# Patient Record
Sex: Female | Born: 1995 | Race: Black or African American | Hispanic: No | Marital: Single | Smoking: Never smoker
Health system: Southern US, Community
[De-identification: ages and names within clinical notes are randomized; demographics above are authoritative.]

## PROBLEM LIST (undated history)

## (undated) DIAGNOSIS — J45909 Unspecified asthma, uncomplicated: Secondary | ICD-10-CM

## (undated) DIAGNOSIS — T7840XA Allergy, unspecified, initial encounter: Secondary | ICD-10-CM

## (undated) HISTORY — PX: OTHER SURGICAL HISTORY: SHX169

## (undated) HISTORY — PX: HERNIA REPAIR: SHX51

## (undated) HISTORY — DX: Allergy, unspecified, initial encounter: T78.40XA

## (undated) HISTORY — DX: Unspecified asthma, uncomplicated: J45.909

## (undated) HISTORY — PX: WISDOM TOOTH EXTRACTION: SHX21

---
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
## 2015-01-25 ENCOUNTER — Emergency Department (HOSPITAL_COMMUNITY)
Admission: EM | Admit: 2015-01-25 | Discharge: 2015-01-25 | Disposition: A | Discharge status: Home / Self Care | Payer: Medicaid Other | Attending: Emergency Medicine | Admitting: Emergency Medicine

## 2015-01-25 ENCOUNTER — Encounter (HOSPITAL_COMMUNITY): Payer: Self-pay | Admitting: Emergency Medicine

## 2015-01-25 DIAGNOSIS — B373 Candidiasis of vulva and vagina: Secondary | ICD-10-CM | POA: Diagnosis not present

## 2015-01-25 DIAGNOSIS — Z3202 Encounter for pregnancy test, result negative: Secondary | ICD-10-CM | POA: Insufficient documentation

## 2015-01-25 DIAGNOSIS — Z7951 Long term (current) use of inhaled steroids: Secondary | ICD-10-CM | POA: Insufficient documentation

## 2015-01-25 DIAGNOSIS — Z79899 Other long term (current) drug therapy: Secondary | ICD-10-CM | POA: Diagnosis not present

## 2015-01-25 DIAGNOSIS — L292 Pruritus vulvae: Secondary | ICD-10-CM | POA: Diagnosis present

## 2015-01-25 DIAGNOSIS — Z91040 Latex allergy status: Secondary | ICD-10-CM | POA: Insufficient documentation

## 2015-01-25 DIAGNOSIS — B3731 Acute candidiasis of vulva and vagina: Secondary | ICD-10-CM

## 2015-01-25 LAB — URINALYSIS, ROUTINE W REFLEX MICROSCOPIC
Bilirubin Urine: NEGATIVE
Glucose, UA: NEGATIVE mg/dL
Ketones, ur: NEGATIVE mg/dL
Nitrite: NEGATIVE
PROTEIN: NEGATIVE mg/dL
Specific Gravity, Urine: 1.021 (ref 1.005–1.030)
Urobilinogen, UA: 0.2 mg/dL (ref 0.0–1.0)
pH: 5 (ref 5.0–8.0)

## 2015-01-25 LAB — WET PREP, GENITAL
Clue Cells Wet Prep HPF POC: NONE SEEN
Trich, Wet Prep: NONE SEEN

## 2015-01-25 LAB — URINE MICROSCOPIC-ADD ON

## 2015-01-25 LAB — PREGNANCY, URINE: Preg Test, Ur: NEGATIVE

## 2015-01-25 MED ORDER — FLUCONAZOLE 150 MG PO TABS
150.0000 mg | ORAL_TABLET | Freq: Once | ORAL | Status: AC
  Administered 2015-01-25: 150 mg via ORAL
  Filled 2015-01-25: qty 1

## 2015-01-25 MED ORDER — FLUCONAZOLE 150 MG PO TABS: 150.0000 mg | ORAL_TABLET | Freq: Every day | ORAL | Status: AC

## 2015-01-25 NOTE — Discharge Instructions (Signed)
Diflucan Rx if sx not resolved in 48 hours.  Candida Infection A Candida infection (also called yeast, fungus, and Monilia infection) is an overgrowth of yeast that can occur anywhere on the body. A yeast infection commonly occurs in warm, moist body areas. Usually, the infection remains localized but can spread to become a systemic infection. A yeast infection may be a sign of a more severe disease such as diabetes, leukemia, or AIDS. A yeast infection can occur in both men and women. In women, Candida vaginitis is a vaginal infection. It is one of the most common causes of vaginitis. Men usually do not have symptoms or know they have an infection until other problems develop. Men may find out they have a yeast infection because their sex partner has a yeast infection. Uncircumcised men are more likely to get a yeast infection than circumcised men. This is because the uncircumcised glans is not exposed to air and does not remain as dry as that of a circumcised glans. Older adults may develop yeast infections around dentures. CAUSES  Women  Antibiotics.  Steroid medication taken for a long time.  Being overweight (obese).  Diabetes.  Poor immune condition.  Certain serious medical conditions.  Immune suppressive medications for organ transplant patients.  Chemotherapy.  Pregnancy.  Menstruation.  Stress and fatigue.  Intravenous drug use.  Oral contraceptives.  Wearing tight-fitting clothes in the crotch area.  Catching it from a sex partner who has a yeast infection.  Spermicide.  Intravenous, urinary, or other catheters. Men  Catching it from a sex partner who has a yeast infection.  Having oral or anal sex with a person who has the infection.  Spermicide.  Diabetes.  Antibiotics.  Poor immune system.  Medications that suppress the immune system.  Intravenous drug use.  Intravenous, urinary, or other catheters. SYMPTOMS  Women  Thick, white vaginal  discharge.  Vaginal itching.  Redness and swelling in and around the vagina.  Irritation of the lips of the vagina and perineum.  Blisters on the vaginal lips and perineum.  Painful sexual intercourse.  Low blood sugar (hypoglycemia).  Painful urination.  Bladder infections.  Intestinal problems such as constipation, indigestion, bad breath, bloating, increase in gas, diarrhea, or loose stools. Men  Men may develop intestinal problems such as constipation, indigestion, bad breath, bloating, increase in gas, diarrhea, or loose stools.  Dry, cracked skin on the penis with itching or discomfort.  Jock itch.  Dry, flaky skin.  Athlete's foot.  Hypoglycemia. DIAGNOSIS  Women  A history and an exam are performed.  The discharge may be examined under a microscope.  A culture may be taken of the discharge. Men  A history and an exam are performed.  Any discharge from the penis or areas of cracked skin will be looked at under the microscope and cultured.  Stool samples may be cultured. TREATMENT  Women  Vaginal antifungal suppositories and creams.  Medicated creams to decrease irritation and itching on the outside of the vagina.  Warm compresses to the perineal area to decrease swelling and discomfort.  Oral antifungal medications.  Medicated vaginal suppositories or cream for repeated or recurrent infections.  Wash and dry the irritation areas before applying the cream.  Eating yogurt with Lactobacillus may help with prevention and treatment.  Sometimes painting the vagina with gentian violet solution may help if creams and suppositories do not work. Men  Antifungal creams and oral antifungal medications.  Sometimes treatment must continue for 30 days after  the symptoms go away to prevent recurrence. HOME CARE INSTRUCTIONS  Women  Use cotton underwear and avoid tight-fitting clothing.  Avoid colored, scented toilet paper and deodorant tampons or  pads.  Do not douche.  Keep your diabetes under control.  Finish all the prescribed medications.  Keep your skin clean and dry.  Consume milk or yogurt with Lactobacillus-active culture regularly. If you get frequent yeast infections and think that is what the infection is, there are over-the-counter medications that you can get. If the infection does not show healing in 3 days, talk to your caregiver.  Tell your sex partner you have a yeast infection. Your partner may need treatment also, especially if your infection does not clear up or recurs. Men  Keep your skin clean and dry.  Keep your diabetes under control.  Finish all prescribed medications.  Tell your sex partner that you have a yeast infection so he or she can be treated if necessary. SEEK MEDICAL CARE IF:   Your symptoms do not clear up or worsen in one week after treatment.  You have an oral temperature above 102 F (38.9 C).  You have trouble swallowing or eating for a prolonged time.  You develop blisters on and around your vagina.  You develop vaginal bleeding and it is not your menstrual period.  You develop abdominal pain.  You develop intestinal problems as mentioned above.  You get weak or light-headed.  You have painful or increased urination.  You have pain during sexual intercourse. MAKE SURE YOU:   Understand these instructions.  Will watch your condition.  Will get help right away if you are not doing well or get worse. Document Released: 01/05/2005 Document Revised: 04/14/2014 Document Reviewed: 04/19/2010 [place] [facility] Patient Information 2015 [place], [place]. This information is not intended to replace advice given to you by your health care provider. Make sure you discuss any questions you have with your health care provider.  Candidal Vulvovaginitis Candidal vulvovaginitis is an infection of the vagina and vulva. The vulva is the skin around the opening of the vagina. This may cause  itching and discomfort in and around the vagina.  HOME CARE  Only take medicine as told by your doctor.  Do not have sex (intercourse) until the infection is healed or as told by your doctor.  Practice safe sex.  Tell your sex partner about your infection.  Do not douche or use tampons.  Wear cotton underwear. Do not wear tight pants or panty hose.  Eat yogurt. This may help treat and prevent yeast infections. GET HELP RIGHT AWAY IF:   You have a fever.  Your problems get worse during treatment or do not get better in 3 days.  You have discomfort, irritation, or itching in your vagina or vulva area.  You have pain after sex.  You start to get belly (abdominal) pain. MAKE SURE YOU:  Understand these instructions.  Will watch your condition.  Will get help right away if you are not doing well or get worse. Document Released: 02/24/2009 Document Revised: 12/03/2013 Document Reviewed: 02/24/2009 [facility] Patient Information 2015 [place], [place]. This information is not intended to replace advice given to you by your health care provider. Make sure you discuss any questions you have with your health care provider.

## 2015-01-25 NOTE — ED Notes (Signed)
Pt states she thinks she may have a yeast infection  Pt is c/o irritation and itching  Denies any discharge

## 2015-01-25 NOTE — ED Provider Notes (Signed)
CSN: [id number]     Arrival date & time 01/25/15  1928 History   First MD Initiated Contact with Patient 01/25/15 1943     Chief Complaint  Patient presents with  . Vaginal Itching      HPI  Patient presents for evaluation of vaginal itching and burning. Started having some symptoms just a is itching and feels like she is swollen and sore. Physician dilation. No history of STD. No history of candida. No UTI symptoms. Denies pregnancy. Has an Implanon for pregnancy prophylaxis. No. For 1 year after it was placed.  History reviewed. No pertinent past medical history. Past Surgical History  Procedure Laterality Date  . Hernia repair    . Cyst removed from right knee     Family History  Problem Relation Age of Onset  . Hypertension Mother   . Hypertension Other   . Diabetes Other    History  Substance Use Topics  . Smoking status: Never Smoker   . Smokeless tobacco: Not on file  . Alcohol Use: No   OB History    No data available     Review of Systems  Constitutional: Negative for fever, chills, diaphoresis, appetite change and fatigue.  HENT: Negative for mouth sores, sore throat and trouble swallowing.   Eyes: Negative for visual disturbance.  Respiratory: Negative for cough, chest tightness, shortness of breath and wheezing.   Cardiovascular: Negative for chest pain.  Gastrointestinal: Negative for nausea, vomiting, abdominal pain, diarrhea and abdominal distention.  Endocrine: Negative for polydipsia, polyphagia and polyuria.  Genitourinary: Positive for vaginal discharge. Negative for dysuria, frequency and hematuria.  Musculoskeletal: Negative for gait problem.  Skin: Negative for color change, pallor and rash.  Neurological: Negative for dizziness, syncope, light-headedness and headaches.  Hematological: Does not bruise/bleed easily.  Psychiatric/Behavioral: Negative for behavioral problems and confusion.      Allergies  Latex  Home Medications   Prior to  Admission medications   Medication Sig Start Date End Date Taking? Authorizing Provider  Albuterol Sulfate (PROVENTIL HFA IN) Inhale 2 puffs into the lungs daily as needed (shortness of breath).   Yes Historical Provider, MD  cetirizine (ZYRTEC) 10 MG tablet Take 10 mg by mouth daily.   Yes Historical Provider, MD  mometasone-formoterol (DULERA) 100-5 MCG/ACT AERO Inhale 2 puffs into the lungs 2 (two) times daily as needed for wheezing.   Yes Historical Provider, MD  montelukast (SINGULAIR) 10 MG tablet Take 10 mg by mouth at bedtime.   Yes Historical Provider, MD  Olopatadine HCl 0.2 % SOLN Apply 1 drop to eye 2 (two) times daily as needed (allergies).   Yes Historical Provider, MD  fluconazole (DIFLUCAN) 150 MG tablet Take 1 tablet (150 mg total) by mouth daily. 01/25/15 02/01/15  Rolland PorterMark Dierks Wach, MD   BP 112/65 mmHg  Pulse 79  Temp(Src) 98.1 F (36.7 C) (Oral)  Resp 18  Wt 125 lb (56.7 kg)  SpO2 99% Physical Exam  Constitutional: She is oriented to person, place, and time. She appears well-developed and well-nourished. No distress.  HENT:  Head: Normocephalic.  Eyes: Conjunctivae are normal. Pupils are equal, round, and reactive to light. No scleral icterus.  Neck: Normal range of motion. Neck supple. No thyromegaly present.  Cardiovascular: Normal rate and regular rhythm.  Exam reveals no gallop and no friction rub.   No murmur heard. Pulmonary/Chest: Effort normal and breath sounds normal. No respiratory distress. She has no wheezes. She has no rales.  Abdominal: Soft. Bowel sounds are normal.  She exhibits no distension. There is no tenderness. There is no rebound.  Genitourinary:    Musculoskeletal: Normal range of motion.  Neurological: She is alert and oriented to person, place, and time.  Skin: Skin is warm and dry. No rash noted.  Psychiatric: She has a normal mood and affect. Her behavior is normal.    ED Course  Procedures (including critical care time) Labs Review Labs  Reviewed  WET PREP, GENITAL - Abnormal; Notable for the following:    Yeast Wet Prep HPF POC MODERATE (*)    WBC, Wet Prep HPF POC FEW (*)    All other components within normal limits  URINALYSIS, ROUTINE W REFLEX MICROSCOPIC - Abnormal; Notable for the following:    Hgb urine dipstick SMALL (*)    Leukocytes, UA MODERATE (*)    All other components within normal limits  PREGNANCY, URINE  URINE MICROSCOPIC-ADD ON  GC/CHLAMYDIA PROBE AMP ([place])    Imaging Review No results found.   EKG Interpretation None      MDM   Final diagnoses:  Candida vaginitis    Given Diflucan. Rx for repeat if not improving.    Rolland Porter, MD 01/25/15 2045

## 2015-01-26 LAB — GC/CHLAMYDIA PROBE AMP (~~LOC~~) NOT AT ARMC
CHLAMYDIA, DNA PROBE: NEGATIVE
Neisseria Gonorrhea: NEGATIVE

## 2015-10-18 ENCOUNTER — Emergency Department (HOSPITAL_COMMUNITY)
Admission: EM | Admit: 2015-10-18 | Discharge: 2015-10-18 | Disposition: A | Discharge status: Home / Self Care | Payer: Medicaid Other | Attending: Physician Assistant | Admitting: Physician Assistant

## 2015-10-18 ENCOUNTER — Emergency Department (HOSPITAL_COMMUNITY): Payer: Medicaid Other

## 2015-10-18 ENCOUNTER — Encounter (HOSPITAL_COMMUNITY): Payer: Self-pay | Admitting: Nurse Practitioner

## 2015-10-18 DIAGNOSIS — S3991XA Unspecified injury of abdomen, initial encounter: Secondary | ICD-10-CM | POA: Insufficient documentation

## 2015-10-18 DIAGNOSIS — Y9389 Activity, other specified: Secondary | ICD-10-CM | POA: Diagnosis not present

## 2015-10-18 DIAGNOSIS — Z91040 Latex allergy status: Secondary | ICD-10-CM | POA: Diagnosis not present

## 2015-10-18 DIAGNOSIS — S3992XA Unspecified injury of lower back, initial encounter: Secondary | ICD-10-CM | POA: Diagnosis not present

## 2015-10-18 DIAGNOSIS — S20311A Abrasion of right front wall of thorax, initial encounter: Secondary | ICD-10-CM | POA: Diagnosis not present

## 2015-10-18 DIAGNOSIS — Z79899 Other long term (current) drug therapy: Secondary | ICD-10-CM | POA: Insufficient documentation

## 2015-10-18 DIAGNOSIS — T148 Other injury of unspecified body region: Secondary | ICD-10-CM | POA: Insufficient documentation

## 2015-10-18 DIAGNOSIS — S299XXA Unspecified injury of thorax, initial encounter: Secondary | ICD-10-CM | POA: Diagnosis present

## 2015-10-18 DIAGNOSIS — Y92410 Unspecified street and highway as the place of occurrence of the external cause: Secondary | ICD-10-CM | POA: Insufficient documentation

## 2015-10-18 DIAGNOSIS — Z7951 Long term (current) use of inhaled steroids: Secondary | ICD-10-CM | POA: Insufficient documentation

## 2015-10-18 DIAGNOSIS — R0789 Other chest pain: Secondary | ICD-10-CM

## 2015-10-18 DIAGNOSIS — Y998 Other external cause status: Secondary | ICD-10-CM | POA: Diagnosis not present

## 2015-10-18 DIAGNOSIS — S199XXA Unspecified injury of neck, initial encounter: Secondary | ICD-10-CM | POA: Diagnosis not present

## 2015-10-18 DIAGNOSIS — T148XXA Other injury of unspecified body region, initial encounter: Secondary | ICD-10-CM

## 2015-10-18 MED ORDER — CYCLOBENZAPRINE HCL 10 MG PO TABS
10.0000 mg | ORAL_TABLET | Freq: Once | ORAL | Status: AC
  Administered 2015-10-18: 10 mg via ORAL
  Filled 2015-10-18: qty 1

## 2015-10-18 MED ORDER — IBUPROFEN 800 MG PO TABS
800.0000 mg | ORAL_TABLET | Freq: Once | ORAL | Status: AC
  Administered 2015-10-18: 800 mg via ORAL
  Filled 2015-10-18: qty 1

## 2015-10-18 MED ORDER — CYCLOBENZAPRINE HCL 10 MG PO TABS: 10.0000 mg | ORAL_TABLET | Freq: Two times a day (BID) | ORAL | Status: DC | PRN

## 2015-10-18 MED ORDER — IBUPROFEN 800 MG PO TABS: 800.0000 mg | ORAL_TABLET | Freq: Three times a day (TID) | ORAL | Status: DC
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
## 2015-10-18 NOTE — Discharge Instructions (Signed)
Chest Wall Pain °Chest wall pain is pain in or around the bones and muscles of your chest. Sometimes, an injury causes this pain. Sometimes, the cause may not be known. This pain may take several weeks or longer to get better. °HOME CARE INSTRUCTIONS  °Pay attention to any changes in your symptoms. Take these actions to help with your pain:  °· Rest as told by your health care provider.   °· Avoid activities that cause pain. These include any activities that use your chest muscles or your abdominal and side muscles to lift heavy items.    °· If directed, apply ice to the painful area: °· Put ice in a plastic bag. °· Place a towel between your skin and the bag. °· Leave the ice on for 20 minutes, 2-3 times per day. °· Take over-the-counter and prescription medicines only as told by your health care provider. °· Do not use tobacco products, including cigarettes, chewing tobacco, and e-cigarettes. If you need help quitting, ask your health care provider. °· Keep all follow-up visits as told by your health care provider. This is important. °SEEK MEDICAL CARE IF: °· You have a fever. °· Your chest pain becomes worse. °· You have new symptoms. °SEEK IMMEDIATE MEDICAL CARE IF: °· You have nausea or vomiting. °· You feel sweaty or light-headed. °· You have a cough with phlegm (sputum) or you cough up blood. °· You develop shortness of breath. °  °This information is not intended to replace advice given to you by your health care provider. Make sure you discuss any questions you have with your health care provider. °  °Document Released: 11/28/2005 Document Revised: 08/19/2015 Document Reviewed: 02/23/2015 °Elsevier Interactive Patient Education ©2016 [employer] °Heat Therapy °Heat therapy can help ease sore, stiff, injured, and tight muscles and joints. Heat relaxes your muscles, which may help ease your pain.  °RISKS AND COMPLICATIONS °If you have any of the following conditions, do not use heat therapy unless your  health care provider has approved: °· Poor circulation. °· Healing wounds or scarred skin in the area being treated. °· Diabetes, heart disease, or high blood pressure. °· Not being able to feel (numbness) the area being treated. °· Unusual swelling of the area being treated. °· Active infections. °· Blood clots. °· Cancer. °· Inability to communicate pain. This may include young children and people who have problems with their brain function (dementia). °· Pregnancy. °Heat therapy should only be used on old, pre-existing, or long-lasting (chronic) injuries. Do not use heat therapy on new injuries unless directed by your health care provider. °HOW TO USE HEAT THERAPY °There are several different kinds of heat therapy, including: °· Moist heat pack. °· Warm water bath. °· Hot water bottle. °· Electric heating pad. °· Heated gel pack. °· Heated wrap. °· Electric heating pad. °Use the heat therapy method suggested by your health care provider. Follow your health care provider's instructions on when and how to use heat therapy. °GENERAL HEAT THERAPY RECOMMENDATIONS °· Do not sleep while using heat therapy. Only use heat therapy while you are awake. °· Your skin may turn pink while using heat therapy. Do not use heat therapy if your skin turns red. °· Do not use heat therapy if you have new pain. °· High heat or long exposure to heat can cause burns. Be careful when using heat therapy to avoid burning your skin. °· Do not use heat therapy on areas of your skin that are already irritated, such as with a   rash or sunburn. SEEK MEDICAL CARE IF:  You have blisters, redness, swelling, or numbness.  You have new pain.  Your pain is worse. MAKE SURE YOU:  Understand these instructions.  Will watch your condition.  Will get help right away if you are not doing well or get worse.   This information is not intended to replace advice given to you by your health care provider. Make sure you discuss any questions you  have with your health care provider.   Document Released: 02/20/2012 Document Revised: 12/19/2014 Document Reviewed: 01/21/2014 Elsevier Interactive Patient Education 2016 [employer] Inc[employer] Muscle Strain A muscle strain is an injury that occurs when a muscle is stretched beyond its normal length. Usually a small number of muscle fibers are torn when this happens. Muscle strain is rated in degrees. First-degree strains have the least amount of muscle fiber tearing and pain. Second-degree and third-degree strains have increasingly more tearing and pain.  Usually, recovery from muscle strain takes 1-2 weeks. Complete healing takes 5-6 weeks.  CAUSES  Muscle strain happens when a sudden, violent force placed on a muscle stretches it too far. This may occur with lifting, sports, or a fall.  RISK FACTORS Muscle strain is especially common in athletes.  SIGNS AND SYMPTOMS At the site of the muscle strain, there may be:  Pain.  Bruising.  Swelling.  Difficulty using the muscle due to pain or lack of normal function. DIAGNOSIS  Your health care provider will perform a physical exam and ask about your medical history. TREATMENT  Often, the best treatment for a muscle strain is resting, icing, and applying cold compresses to the injured area.  HOME CARE INSTRUCTIONS   Use the PRICE method of treatment to promote muscle healing during the first 2-3 days after your injury. The PRICE method involves:  Protecting the muscle from being injured again.  Restricting your activity and resting the injured body part.  Icing your injury. To do this, put ice in a plastic bag. Place a towel between your skin and the bag. Then, apply the ice and leave it on from 15-20 minutes each hour. After the third day, switch to moist heat packs.  Apply compression to the injured area with a splint or elastic bandage. Be careful not to wrap it too tightly. This may interfere with blood circulation or increase  swelling.  Elevate the injured body part above the level of your heart as often as you can.  Only take over-the-counter or prescription medicines for pain, discomfort, or fever as directed by your health care provider.  Warming up prior to exercise helps to prevent future muscle strains. SEEK MEDICAL CARE IF:   You have increasing pain or swelling in the injured area.  You have numbness, tingling, or a significant loss of strength in the injured area. MAKE SURE YOU:   Understand these instructions.  Will watch your condition.  Will get help right away if you are not doing well or get worse.   This information is not intended to replace advice given to you by your health care provider. Make sure you discuss any questions you have with your health care provider.   Document Released: 11/28/2005 Document Revised: 09/18/2013 Document Reviewed: 06/27/2013 Elsevier Interactive Patient Education 2016 [employer] Tourist information centre managerMotor Vehicle Collision It is common to have multiple bruises and sore muscles after a motor vehicle collision (MVC). These tend to feel worse for the first 24 hours. You may have the most stiffness and soreness over the  first several hours. You may also feel worse when you wake up the first morning after your collision. After this point, you will usually begin to improve with each day. The speed of improvement often depends on the severity of the collision, the number of injuries, and the location and nature of these injuries. HOME CARE INSTRUCTIONS  Put ice on the injured area.  Put ice in a plastic bag.  Place a towel between your skin and the bag.  Leave the ice on for 15-20 minutes, 3-4 times a day, or as directed by your health care provider.  Drink enough fluids to keep your urine clear or pale yellow. Do not drink alcohol.  Take a warm shower or bath once or twice a day. This will increase blood flow to sore muscles.  You may return to activities as directed by your  caregiver. Be careful when lifting, as this may aggravate neck or back pain.  Only take over-the-counter or prescription medicines for pain, discomfort, or fever as directed by your caregiver. Do not use aspirin. This may increase bruising and bleeding. SEEK IMMEDIATE MEDICAL CARE IF:  You have numbness, tingling, or weakness in the arms or legs.  You develop severe headaches not relieved with medicine.  You have severe neck pain, especially tenderness in the middle of the back of your neck.  You have changes in bowel or bladder control.  There is increasing pain in any area of the body.  You have shortness of breath, light-headedness, dizziness, or fainting.  You have chest pain.  You feel sick to your stomach (nauseous), throw up (vomit), or sweat.  You have increasing abdominal discomfort.  There is blood in your urine, stool, or vomit.  You have pain in your shoulder (shoulder strap areas).  You feel your symptoms are getting worse. MAKE SURE YOU:  Understand these instructions.  Will watch your condition.  Will get help right away if you are not doing well or get worse.   This information is not intended to replace advice given to you by your health care provider. Make sure you discuss any questions you have with your health care provider.   Document Released: 11/28/2005 Document Revised: 12/19/2014 Document Reviewed: 04/27/2011 Elsevier Interactive Patient Education [employer]
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
## 2017-01-11 ENCOUNTER — Telehealth: Payer: Self-pay | Admitting: General Practice

## 2017-01-11 NOTE — Telephone Encounter (Signed)
APT. REMINDER CALL, LMTCB °

## 2017-01-12 ENCOUNTER — Ambulatory Visit: Payer: Medicaid Other

## 2017-01-16 ENCOUNTER — Encounter: Payer: Self-pay | Admitting: Internal Medicine

## 2017-01-16 ENCOUNTER — Ambulatory Visit (INDEPENDENT_AMBULATORY_CARE_PROVIDER_SITE_OTHER): Payer: Medicaid Other | Admitting: Internal Medicine

## 2017-01-16 DIAGNOSIS — Z79899 Other long term (current) drug therapy: Secondary | ICD-10-CM

## 2017-01-16 DIAGNOSIS — J302 Other seasonal allergic rhinitis: Secondary | ICD-10-CM | POA: Insufficient documentation

## 2017-01-16 DIAGNOSIS — Z9689 Presence of other specified functional implants: Secondary | ICD-10-CM

## 2017-01-16 DIAGNOSIS — J4520 Mild intermittent asthma, uncomplicated: Secondary | ICD-10-CM | POA: Diagnosis present

## 2017-01-16 DIAGNOSIS — Z3046 Encounter for surveillance of implantable subdermal contraceptive: Secondary | ICD-10-CM | POA: Insufficient documentation

## 2017-01-16 DIAGNOSIS — Z7951 Long term (current) use of inhaled steroids: Secondary | ICD-10-CM

## 2017-01-16 DIAGNOSIS — Z0000 Encounter for general adult medical examination without abnormal findings: Secondary | ICD-10-CM | POA: Insufficient documentation

## 2017-01-16 MED ORDER — CETIRIZINE HCL 10 MG PO TABS: 10.0000 mg | ORAL_TABLET | Freq: Every day | ORAL | 11 refills | Status: AC

## 2017-01-16 MED ORDER — MOMETASONE FURO-FORMOTEROL FUM 100-5 MCG/ACT IN AERO: 2.0000 | INHALATION_SPRAY | Freq: Two times a day (BID) | RESPIRATORY_TRACT | 5 refills | Status: DC | PRN

## 2017-01-16 MED ORDER — ALBUTEROL SULFATE HFA 108 (90 BASE) MCG/ACT IN AERS: 2.0000 | INHALATION_SPRAY | Freq: Every day | RESPIRATORY_TRACT | 5 refills | Status: DC | PRN

## 2017-01-16 MED ORDER — MONTELUKAST SODIUM 10 MG PO TABS: 10.0000 mg | ORAL_TABLET | Freq: Every day | ORAL | 11 refills | Status: DC

## 2017-01-16 NOTE — Assessment & Plan Note (Signed)
Patient states that she was diagnosed with asthma as a child. Her symptoms are well controlled. She is currently using albuterol and Dulera prn, cetrizine and singulair. Her symptoms are more bothersome during the springtime. In the last month, she had one nighttime awakening with wheezing. She has not used her albuterol inhaler in the last one week. She is not limited in her daily activities.   Assessment: Mild Intermittent Asthma  Plan: -Continue albuterol prn -Continue Dulera (prn during winter- BID in spring/summer) -Continue Cetirizine and Singulair

## 2017-01-16 NOTE — Progress Notes (Signed)
    CC: asthma follow up  HPI: [name] is a 21 y.o. female with PMHx of Mild Intermittent Asthma who presents to the clinic for follow up for asthma.   Patient states that she was diagnosed with asthma as a child. Her symptoms are well controlled. She is currently using albuterol and Dulera prn, cetrizine and singulair. Her symptoms are more bothersome during the springtime. In the last month, she had one nighttime awakening with wheezing. She has not used her albuterol inhaler in the last one week. She is not limited in her daily activities.   Past Medical History:  Diagnosis Date  . Allergy   . Asthma     Review of Systems: Please see pertinent ROS reviewed in HPI and problem based charting.   Physical Exam: Vitals:   01/16/17 0958  BP: 114/65  Pulse: 80  Temp: 97.8 F (36.6 C)  TempSrc: Oral  SpO2: 100%  Weight: 119 lb 11.2 oz (54.3 kg)   General: Vital signs reviewed.  Patient is well-developed and well-nourished, in no acute distress and cooperative with exam.  Head: Normocephalic and atraumatic. Eyes: EOMI, conjunctivae normal, no scleral icterus.  Ears: Normal tympanic membranes bilaterally Mouth: Normal posterior oropharynx.  Nose: Erythematous, edematous nasal turbinates.  Neck: Supple, trachea midline, no thyromegaly present.  Cardiovascular: RRR, S1 normal, S2 normal, no murmurs, gallops, or rubs. Pulmonary/Chest: Mild expiratory wheeze in left mid lung field, no rales, or rhonchi. Abdominal: Soft, non-tender, non-distended, BS +, no masses, organomegaly, or guarding present.  Extremities: No lower extremity edema bilaterally, pulses symmetric and intact bilaterally.  Skin: Warm, dry and intact. No rashes or erythema. Psychiatric: Normal mood and affect. speech and behavior is normal. Cognition and memory are normal.   Assessment & Plan:  See encounters tab for problem based medical decision making. Patient discussed with Dr. Rogelia BogaButcher

## 2017-01-16 NOTE — Assessment & Plan Note (Signed)
Due for Pap Smear. Patient would like referral to Ob/Gyn for removal of nexplanon and pap smear. Also discussed that she can received her pap smear here as well.   Plan: -Referral to Ob/Gyn

## 2017-01-16 NOTE — Assessment & Plan Note (Signed)
Patient has a Nexplanon implant placed >3 years ago, she reports she was supposed to get it out in the fall. She would like a referral to Ob/Gyn for removal.  Plan: -referral to Ob/Gyn

## 2017-01-16 NOTE — Patient Instructions (Signed)
Ms. Evelyn Rodriguez,  I have refilled your Singulair, Dulera, Albuterol, and Zyrtec. Please continue treatment as prescribed. We will also refer you back to Ob/Gyn for Nexplanon removal and a Pap Smear. Follow up with us in one year, sooner if needed.

## 2017-01-16 NOTE — Assessment & Plan Note (Signed)
Controlled on current regimen.   Plan: -Continue Cetirizine and Singulair

## 2017-01-17 ENCOUNTER — Telehealth: Payer: Self-pay | Admitting: *Deleted

## 2017-01-17 MED ORDER — MONTELUKAST SODIUM 10 MG PO TABS: 10.0000 mg | ORAL_TABLET | Freq: Every day | ORAL | 11 refills | Status: AC

## 2017-01-17 MED ORDER — ALBUTEROL SULFATE HFA 108 (90 BASE) MCG/ACT IN AERS: 2.0000 | INHALATION_SPRAY | Freq: Every day | RESPIRATORY_TRACT | 5 refills | Status: AC | PRN

## 2017-01-17 MED ORDER — MOMETASONE FURO-FORMOTEROL FUM 100-5 MCG/ACT IN AERO: 2.0000 | INHALATION_SPRAY | Freq: Two times a day (BID) | RESPIRATORY_TRACT | 5 refills | Status: AC | PRN

## 2017-01-17 MED FILL — MONTELUKAST SOD 10 MG TAB: 10 | 30 days supply | Qty: 30 | Fill #0

## 2017-01-17 MED FILL — PROVENTIL HFA 90 MCG INH: 108 (90 BAS | 25 days supply | Qty: 7 | Fill #0

## 2017-01-17 MED FILL — DULERA 100 MCG/5 MCG INH: 100-5 | 30 days supply | Qty: 13 | Fill #0

## 2017-01-17 NOTE — Telephone Encounter (Signed)
LVM REGARDING GYN REFERRAL / [facility]. TO CALL OFFICE TO SET UP APPOINTMENT. SPOKE WITH BLAIR. [phone].

## 2017-01-17 NOTE — Addendum Note (Signed)
Addended by: Karlene LinemanBURNS, Mckenzie Bove R on: 01/17/2017 09:17 AM   Modules accepted: Orders

## 2017-01-17 NOTE — Telephone Encounter (Signed)
Patient's mother requesting medications sent with "IM program" [facility]. MD made aware.

## 2017-01-17 NOTE — Progress Notes (Signed)
Internal Medicine Clinic Attending  Case discussed with Dr. Burns soon after the resident saw the patient.  We reviewed the resident's history and exam and pertinent patient test results.  I agree with the assessment, diagnosis, and plan of care documented in the resident's note. 

## 2017-01-17 NOTE — Telephone Encounter (Signed)
SPOKE WITH BLAIR AT [facility].  BLAIR STATES THAT PATIENT IS ALREADY AT PATIENT OF THEIR OFFICE/ SHE HAS NOT BEEN SEEN IN 2 YEARS. INFORMED HER THAT PATIENT HAS MEDICAID. BLAIR STATES PATIENT JUST NEEDS TO CALL AND SET UP APPOINTMENT (TO HAVE IMPLANT REMOVED)

## 2017-04-17 ENCOUNTER — Ambulatory Visit: Payer: No Typology Code available for payment source

## 2017-04-18 ENCOUNTER — Ambulatory Visit: Payer: No Typology Code available for payment source

## 2017-04-24 ENCOUNTER — Ambulatory Visit: Payer: No Typology Code available for payment source

## 2017-06-20 ENCOUNTER — Ambulatory Visit (INDEPENDENT_AMBULATORY_CARE_PROVIDER_SITE_OTHER): Payer: Medicaid Other | Admitting: Internal Medicine

## 2017-06-20 ENCOUNTER — Other Ambulatory Visit (HOSPITAL_COMMUNITY)
Admission: RE | Admit: 2017-06-20 | Discharge: 2017-06-20 | Disposition: A | Discharge status: Home / Self Care | Payer: Medicaid Other | Source: Ambulatory Visit | Attending: Internal Medicine | Admitting: Internal Medicine

## 2017-06-20 VITALS — BP 107/65 | HR 77 | Temp 97.8°F | Wt 122.3 lb

## 2017-06-20 DIAGNOSIS — Z0000 Encounter for general adult medical examination without abnormal findings: Secondary | ICD-10-CM | POA: Insufficient documentation

## 2017-06-20 DIAGNOSIS — Z3046 Encounter for surveillance of implantable subdermal contraceptive: Secondary | ICD-10-CM | POA: Diagnosis not present

## 2017-06-20 DIAGNOSIS — J4520 Mild intermittent asthma, uncomplicated: Secondary | ICD-10-CM | POA: Diagnosis present

## 2017-06-20 MED ORDER — METRONIDAZOLE 500 MG PO TABS: 500.0000 mg | ORAL_TABLET | Freq: Two times a day (BID) | ORAL | 0 refills | Status: AC

## 2017-06-20 NOTE — Assessment & Plan Note (Signed)
I do not see prior PFTs in chart however pt states she was diagnosed with asthma as a child. She is well controlled currently and hasnt required any prn inhalers. She does not smoke. She does not feel limited in her daily activities.  -Consider PFTs -Continue current management with zyrtec, singular and prn albuterol

## 2017-06-20 NOTE — Progress Notes (Signed)
   CC: follow-up of asthma, PAP  HPI:  [name] is a 21 y.o. F with reported asthma since childhood who presents for follow-up of this today as well as evaluation for PAP. In addition, she wonders about Nexplanon removal.  Patient reports compliance with singular and zyrtec and has not required her PRN inhalers recently. She denies any wheezing or SOB and feels overall well controlled.   She is quite insistent on PAP today. She denies any unprotected sexual intercourse, new partners or dysuria however seems a bit concerned about her risk of HPV, gonorrhea and chlamydia. She denies any prior history of STDs. She does report a fishy odor lately with whiteish-clear discharge.   Past Medical History:  Diagnosis Date  . Allergy   . Asthma    Review of Systems:   General: Denies fevers, chills, weight loss, fatigue HEENT: Denies changes in vision, sore throat, dysphagia Cardiac: Denies CP, SOB, palpitations Pulmonary: Denies cough, wheezes, PND Abd: Denies diarrhea, constipation, changes in bowels Extremities: Denies weakness or swelling GU: Denies any dysuria, pelvic pain  Physical Exam: General: Alert, in no acute distress. Pleasant and conversant. Seems a little anxious. HEENT: No icterus, injection or ptosis. No hoarseness or dysarthria  Cardiac: RRR, no MGR appreciated Pulmonary: CTA BL with normal WOB on RA. Able to speak in complete sentences Abd: Soft, non-tended. +bs Extremities: Warm, perfused. No significant pedal edema.  GU: Cervix without erythema, petechia or significant discharge. Some leukorrhea however appears physiologic. No lesions, ulcerations.   Vitals:   06/20/17 1430  BP: 107/65  Pulse: 77  Temp: 97.8 F (36.6 C)  TempSrc: Oral  SpO2: 100%  Weight: 122 lb 4.8 oz (55.5 kg)   Assessment & Plan:   See Encounters Tab for problem based charting.  Patient discussed with Dr. Heide SparkNarendra

## 2017-06-20 NOTE — Patient Instructions (Addendum)
It was an absolute pleasure meeting you today! I'm glad you are doing well.  Today we talked about your asthma. For this, please continue taking Singulair and Zyrtec. I'm glad you havent required any albuterol lately. Please call the office if you require this 2-3 times per week.   Today we also talked about seeing OB/GYN for having your Nexplanon removed. It is important that you see a specialist to get this removed soon. Today we performed a PAP and I will call you with the results.   I've sent in the prescription to your pharmacy. Do not drink alcohol with this medication. Call me if your symptoms do not improve or if they worsen.

## 2017-06-22 LAB — CYTOLOGY - PAP: DIAGNOSIS: NEGATIVE

## 2017-06-22 LAB — CERVICOVAGINAL ANCILLARY ONLY
Chlamydia: NEGATIVE
Neisseria Gonorrhea: NEGATIVE

## 2017-06-22 NOTE — Progress Notes (Signed)
Internal Medicine Clinic Attending  Case discussed with Dr. Molt soon after the resident saw the patient.  We reviewed the resident's history and exam and pertinent patient test results.  I agree with the assessment, diagnosis, and plan of care documented in the resident's note.  

## 2017-06-22 NOTE — Assessment & Plan Note (Signed)
Have again recommended OB/GYN referral to have this removed as it was placed >3 years ago however patient refused at this time. 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 [facility]

## 2017-06-22 NOTE — Assessment & Plan Note (Signed)
Patient quite insistent on PAP today and had lots of questions about the procedure and what we are screening for. She notes she has not had any unprotected sex or new partners recently and she denies any dysuria, vaginal pain or discharge. She does notice a fishy odor recently.  -PAP performed which was normal.  -Screen for chlamydia and gonorrhea was normal -Could consider further screening with HIV test at next visit.  -Rx for Flagyl for BV -Counseled on appropriate feminine care products to reduce further risk of BV

## 2017-06-30 ENCOUNTER — Telehealth: Payer: Self-pay | Admitting: Internal Medicine

## 2017-06-30 NOTE — Telephone Encounter (Signed)
TALKED TO PATIENT GAVE HER LIST OF ITEMS NEEDED TO COMPLETE [facility] APP WILL BRING IN NEXT WEEK

## 2017-09-29 ENCOUNTER — Ambulatory Visit (INDEPENDENT_AMBULATORY_CARE_PROVIDER_SITE_OTHER): Payer: Self-pay | Admitting: *Deleted

## 2017-09-29 DIAGNOSIS — Z23 Encounter for immunization: Secondary | ICD-10-CM

## 2018-02-19 ENCOUNTER — Emergency Department (HOSPITAL_COMMUNITY)
Admission: EM | Admit: 2018-02-19 | Discharge: 2018-02-19 | Disposition: A | Discharge status: Home / Self Care | Payer: BLUE CROSS/BLUE SHIELD | Attending: Physician Assistant | Admitting: Physician Assistant

## 2018-02-19 ENCOUNTER — Encounter (HOSPITAL_COMMUNITY): Payer: Self-pay | Admitting: Emergency Medicine

## 2018-02-19 DIAGNOSIS — Z79899 Other long term (current) drug therapy: Secondary | ICD-10-CM | POA: Insufficient documentation

## 2018-02-19 DIAGNOSIS — N390 Urinary tract infection, site not specified: Secondary | ICD-10-CM | POA: Insufficient documentation

## 2018-02-19 DIAGNOSIS — R350 Frequency of micturition: Secondary | ICD-10-CM | POA: Diagnosis present

## 2018-02-19 DIAGNOSIS — J45909 Unspecified asthma, uncomplicated: Secondary | ICD-10-CM | POA: Insufficient documentation

## 2018-02-19 DIAGNOSIS — R319 Hematuria, unspecified: Secondary | ICD-10-CM | POA: Diagnosis not present

## 2018-02-19 LAB — URINALYSIS, ROUTINE W REFLEX MICROSCOPIC
Bilirubin Urine: NEGATIVE
Glucose, UA: NEGATIVE mg/dL
Ketones, ur: NEGATIVE mg/dL
Nitrite: NEGATIVE
PROTEIN: 30 mg/dL — AB
Specific Gravity, Urine: 1.021 (ref 1.005–1.030)
pH: 6 (ref 5.0–8.0)

## 2018-02-19 LAB — POC URINE PREG, ED: Preg Test, Ur: NEGATIVE

## 2018-02-19 MED ORDER — SULFAMETHOXAZOLE-TRIMETHOPRIM 800-160 MG PO TABS: 1.0000 | ORAL_TABLET | Freq: Two times a day (BID) | ORAL | 0 refills | Status: AC

## 2018-02-19 MED ORDER — PHENAZOPYRIDINE HCL 200 MG PO TABS: 200.0000 mg | ORAL_TABLET | Freq: Three times a day (TID) | ORAL | 0 refills | Status: AC

## 2018-02-19 NOTE — ED Notes (Signed)
Discharge instructions reviewed with patient. Patient verbalizes understanding. VSS.   

## 2018-02-19 NOTE — ED Triage Notes (Signed)
Pt c/o pain with urination and urinary frequency. Pt states she noticed small amount of blood in her urine this morning. Pt denies abdominal / pelvic / flank pain. C/o burning after urinating.

## 2018-02-19 NOTE — ED Provider Notes (Signed)
[facility]-EMERGENCY DEPT Provider Note   CSN: [id number] Arrival date & time: 02/19/18  1142     History   Chief Complaint Chief Complaint  Patient presents with  . Dysuria  . Urinary Frequency    HPI Evelyn Rodriguez is a 22 y.o. female here with UTI symptoms patient reports that a couple days ago she began having frequency and urgency and then dysuria. Today she noted blood in her urine. Patient reports she is not concerned about STI's as she recently saw her doctor and had testing that was negative and no new partners.  The history is provided by the patient. No language interpreter was used.  Dysuria   This is a new problem. The current episode started 2 days ago. The problem occurs every urination. The problem has been gradually worsening. The quality of the pain is described as burning. There has been no fever. She is sexually active. There is no history of pyelonephritis. Associated symptoms include frequency and urgency. Pertinent negatives include no chills. She has tried nothing for the symptoms.  Urinary Frequency  Pertinent negatives include no abdominal pain and no headaches.    Past Medical History:  Diagnosis Date  . Allergy   . Asthma     Patient Active Problem List   Diagnosis Date Noted  . Mild intermittent asthma 01/16/2017  . Nexplanon removal 01/16/2017  . Seasonal allergies 01/16/2017  . Healthcare maintenance 01/16/2017    Past Surgical History:  Procedure Laterality Date  . cyst removed from right knee    . HERNIA REPAIR      OB History    No data available       Home Medications    Prior to Admission medications   Medication Sig Start Date End Date Taking? Authorizing Provider  albuterol (PROVENTIL HFA) 108 (90 Base) MCG/ACT inhaler Inhale 2 puffs into the lungs daily as needed (shortness of breath). 01/17/17   Burns, Tinnie GensAlexa R, MD  cetirizine (ZYRTEC) 10 MG tablet Take 1 tablet (10 mg total) by mouth daily.  01/16/17   Burns, Tinnie GensAlexa R, MD  mometasone-formoterol (DULERA) 100-5 MCG/ACT AERO Inhale 2 puffs into the lungs 2 (two) times daily as needed for wheezing. 01/17/17   Burns, Tinnie GensAlexa R, MD  montelukast (SINGULAIR) 10 MG tablet Take 1 tablet (10 mg total) by mouth at bedtime. 01/17/17   Burns, Tinnie GensAlexa R, MD  phenazopyridine (PYRIDIUM) 200 MG tablet Take 1 tablet (200 mg total) by mouth 3 (three) times daily. 02/19/18   Janne NapoleonNeese, Saje Gallop M, NP  sulfamethoxazole-trimethoprim (BACTRIM DS,SEPTRA DS) 800-160 MG tablet Take 1 tablet by mouth 2 (two) times daily for 7 days. 02/19/18 02/26/18  Janne NapoleonNeese, Wynter Grave M, NP    Family History Family History  Problem Relation Age of Onset  . Hypertension Mother   . Hypertension Other   . Diabetes Other     Social History Social History   Tobacco Use  . Smoking status: Never Smoker  . Smokeless tobacco: Never Used  Substance Use Topics  . Alcohol use: No  . Drug use: No     Allergies   Patient has no known allergies.   Review of Systems Review of Systems  Constitutional: Negative for chills and fever.  HENT: Negative.   Respiratory: Negative for cough.   Gastrointestinal: Negative for abdominal pain.  Genitourinary: Positive for dysuria, frequency and urgency.  Skin: Negative for rash.  Neurological: Negative for headaches.     Physical Exam Updated Vital Signs BP 123/80 (  BP Location: Right Arm)   Pulse 69   Temp 98.8 F (37.1 C) (Oral)   Resp 16   LMP 02/05/2018   SpO2 100%   Physical Exam  Constitutional: She appears well-developed and well-nourished. No distress.  HENT:  Head: Normocephalic.  Eyes: EOM are normal.  Neck: Neck supple.  Cardiovascular: Normal rate.  Pulmonary/Chest: Effort normal.  Abdominal: Soft. There is no tenderness. There is no CVA tenderness.  Musculoskeletal: Normal range of motion.  Neurological: She is alert.  Skin: Skin is warm and dry.  Psychiatric: She has a normal mood and affect.  Nursing note and vitals  reviewed.    ED Treatments / Results  Labs (all labs ordered are listed, but only abnormal results are displayed) Labs Reviewed  URINALYSIS, ROUTINE W REFLEX MICROSCOPIC - Abnormal; Notable for the following components:      Result Value   APPearance HAZY (*)    Hgb urine dipstick LARGE (*)    Protein, ur 30 (*)    Leukocytes, UA MODERATE (*)    Bacteria, UA RARE (*)    Squamous Epithelial / LPF 0-5 (*)    All other components within normal limits  URINE CULTURE  POC URINE PREG, ED  Radiology No results found.  Procedures Procedures (including critical care time)  Medications Ordered in ED Medications - No data to display  Urine with hematuria, TNTC WBC's, protein and LE  Initial Impression / Assessment and Plan / ED Course  I have reviewed the triage vital signs and the nursing notes. Pt has been diagnosed with a UTI. Pt is afebrile, no CVA tenderness, normotensive, and denies N/V. Pt to be dc home with antibiotics and instructions to follow up with PCP if symptoms persist.  Final Clinical Impressions(s) / ED Diagnoses   Final diagnoses:  Urinary tract infection with hematuria, site unspecified    ED Discharge Orders        Ordered    sulfamethoxazole-trimethoprim (BACTRIM DS,SEPTRA DS) 800-160 MG tablet  2 times daily     02/19/18 1247    phenazopyridine (PYRIDIUM) 200 MG tablet  3 times daily     02/19/18 1247       Damian Leavell Foreman, [place] 02/19/18 1314    Abelino Derrick, MD 02/20/18 1622

## 2018-02-19 NOTE — Discharge Instructions (Signed)
Follow up with your doctor.  Return here as needed 

## 2018-02-21 LAB — URINE CULTURE

## 2018-02-22 ENCOUNTER — Telehealth: Payer: Self-pay | Admitting: *Deleted

## 2018-02-22 NOTE — Telephone Encounter (Signed)
Post ED Visit - Positive Culture Follow-up  Culture report reviewed by antimicrobial stewardship pharmacist:  []  Enzo BiNathan Batchelder, Pharm.D. []  Celedonio MiyamotoJeremy Frens, Pharm.D., BCPS AQ-ID []  Garvin FilaMike Maccia, Pharm.D., BCPS []  Georgina PillionElizabeth Martin, Pharm.D., BCPS []  DanburyMinh Pham, [address] BoulevardPharm.D., BCPS, AAHIVP []  Estella HuskMichelle Turner, Pharm.D., BCPS, AAHIVP []  Lysle Pearlachel Rumbarger, PharmD, BCPS []  Blake DivineShannon Parkey, PharmD []  Pollyann SamplesAndy Johnston, PharmD, BCPS Adline PotterSabrina Dunham, PharmD  Positive urine culture Treated with Sulfamethoxazole-Trimethoprim, organism sensitive to the same and no further patient follow-up is required at this time.  Virl AxeRobertson, Correna Meacham [facility] 02/22/2018, 9:49 AM

## 2022-08-02 ENCOUNTER — Encounter (HOSPITAL_COMMUNITY): Payer: Self-pay | Admitting: Obstetrics and Gynecology

## 2022-08-02 ENCOUNTER — Inpatient Hospital Stay (HOSPITAL_COMMUNITY): Payer: 59

## 2022-08-02 ENCOUNTER — Inpatient Hospital Stay (HOSPITAL_COMMUNITY)
Admission: AD | Admit: 2022-08-02 | Discharge: 2022-08-02 | Disposition: A | Discharge status: Home / Self Care | Payer: 59 | Attending: Obstetrics and Gynecology | Admitting: Obstetrics and Gynecology

## 2022-08-02 DIAGNOSIS — R109 Unspecified abdominal pain: Secondary | ICD-10-CM

## 2022-08-02 DIAGNOSIS — O26891 Other specified pregnancy related conditions, first trimester: Secondary | ICD-10-CM

## 2022-08-02 DIAGNOSIS — Z640 Problems related to unwanted pregnancy: Secondary | ICD-10-CM

## 2022-08-02 DIAGNOSIS — Z3A01 Less than 8 weeks gestation of pregnancy: Secondary | ICD-10-CM | POA: Diagnosis not present

## 2022-08-02 DIAGNOSIS — Z3490 Encounter for supervision of normal pregnancy, unspecified, unspecified trimester: Secondary | ICD-10-CM

## 2022-08-02 LAB — URINALYSIS, ROUTINE W REFLEX MICROSCOPIC
Bilirubin Urine: NEGATIVE
Glucose, UA: NEGATIVE mg/dL
Ketones, ur: NEGATIVE mg/dL
Leukocytes,Ua: NEGATIVE
Nitrite: NEGATIVE
Protein, ur: NEGATIVE mg/dL
Specific Gravity, Urine: 1.017 (ref 1.005–1.030)
pH: 5 (ref 5.0–8.0)

## 2022-08-02 LAB — CBC
HCT: 41.9 % (ref 36.0–46.0)
Hemoglobin: 14.6 g/dL (ref 12.0–15.0)
MCH: 31.3 pg (ref 26.0–34.0)
MCHC: 34.8 g/dL (ref 30.0–36.0)
MCV: 89.9 fL (ref 80.0–100.0)
Platelets: 280 10*3/uL (ref 150–400)
RBC: 4.66 MIL/uL (ref 3.87–5.11)
RDW: 11.7 % (ref 11.5–15.5)
WBC: 7.2 10*3/uL (ref 4.0–10.5)
nRBC: 0 % (ref 0.0–0.2)

## 2022-08-02 LAB — ABO/RH: ABO/RH(D): B POS

## 2022-08-02 LAB — HCG, QUANTITATIVE, PREGNANCY: hCG, Beta Chain, Quant, S: 2311 m[IU]/mL — ABNORMAL HIGH (ref <5)

## 2022-08-02 LAB — WET PREP, GENITAL
Clue Cells Wet Prep HPF POC: NONE SEEN
Sperm: NONE SEEN
Trich, Wet Prep: NONE SEEN
WBC, Wet Prep HPF POC: <10 (ref <10)
Yeast Wet Prep HPF POC: NONE SEEN

## 2022-08-02 LAB — POCT PREGNANCY, URINE: Preg Test, Ur: POSITIVE — AB

## 2022-08-02 NOTE — MAU Provider Note (Signed)
History     CSN: [id number]  Arrival date and time: 08/02/22 1044   Event Date/Time   First Provider Initiated Contact with Patient 08/02/22 1131      Chief Complaint  Patient presents with   Abdominal Pain   Possible Pregnancy   Evelyn Rodriguez, a  26 y.o. G2P0010 at [redacted]w[redacted]d presents to MAU complaints of cramping for the last week. Patient states when its really bad an 8/10, but currently not experiencing any pain. She attempted ibuprofen with some relief. She had a positive UPT at home and Micah Flesher to [facility]. [facility] findings were suspicious and could not rule out ectopic. She denies vaginal bleeding, abnormal discharge and urinary symptoms.          OB History     Gravida  2   Para      Term      Preterm      AB  1   Living         SAB      IAB      Ectopic      Multiple      Live Births              Past Medical History:  Diagnosis Date   Allergy    Asthma     Past Surgical History:  Procedure Laterality Date   cyst removed from right knee     HERNIA REPAIR     WISDOM TOOTH EXTRACTION      Family History  Problem Relation Age of Onset   Hypertension Mother    Hypertension Other    Diabetes Other     Social History   Tobacco Use   Smoking status: Never   Smokeless tobacco: Never  Vaping Use   Vaping Use: Never used  Substance Use Topics   Alcohol use: Yes    Comment: occasionally   Drug use: No    Allergies: No Known Allergies  Medications Prior to Admission  Medication Sig Dispense Refill Last Dose   albuterol (PROVENTIL HFA) 108 (90 Base) MCG/ACT inhaler Inhale 2 puffs into the lungs daily as needed (shortness of breath). 8 g 5    cetirizine (ZYRTEC) 10 MG tablet Take 1 tablet (10 mg total) by mouth daily. 30 tablet 11    mometasone-formoterol (DULERA) 100-5 MCG/ACT AERO Inhale 2 puffs into the lungs 2 (two) times daily as needed for wheezing. 8.8 g 5    montelukast  (SINGULAIR) 10 MG tablet Take 1 tablet (10 mg total) by mouth at bedtime. 30 tablet 11    phenazopyridine (PYRIDIUM) 200 MG tablet Take 1 tablet (200 mg total) by mouth 3 (three) times daily. 6 tablet 0     Review of Systems  Constitutional:  Negative for chills, fatigue and fever.  Eyes:  Negative for pain and visual disturbance.  Respiratory:  Negative for apnea, shortness of breath and wheezing.   Cardiovascular:  Negative for chest pain and palpitations.  Gastrointestinal:  Positive for abdominal pain. Negative for constipation, diarrhea, nausea and vomiting.  Genitourinary:  Positive for pelvic pain. Negative for difficulty urinating, dysuria, vaginal bleeding, vaginal discharge and vaginal pain.  Musculoskeletal:  Negative for back pain.  Neurological:  Negative for seizures, weakness and headaches.  Psychiatric/Behavioral:  Negative for suicidal ideas.    Physical Exam   Blood pressure 127/77, pulse 82, temperature 97.8 F (36.6 C), temperature source Oral, resp. rate 16, height 5\' 1"  (1.549 m),  weight 60.3 kg, last menstrual period 06/28/2022, SpO2 100 %.  Physical Exam Vitals and nursing note reviewed.  Constitutional:      General: She is not in acute distress.    Appearance: Normal appearance.  HENT:     Head: Normocephalic.  Cardiovascular:     Rate and Rhythm: Normal rate and regular rhythm.  Pulmonary:     Effort: Pulmonary effort is normal.     Breath sounds: Normal breath sounds.  Abdominal:     Palpations: Abdomen is soft.     Tenderness: There is no abdominal tenderness.  Musculoskeletal:     Cervical back: Normal range of motion.  Skin:    General: Skin is warm and dry.  Neurological:     Mental Status: She is alert and oriented to person, place, and time.  Psychiatric:        Mood and Affect: Mood normal.        Behavior: Behavior normal.     MAU Course  Procedures Orders Placed This Encounter  Procedures   Wet prep, genital   Culture, OB Urine    US OB LESS THAN 14 WEEKS WITH OB TRANSVAGINAL   Urinalysis, Routine w reflex microscopic Urine, Clean Catch   CBC   hCG, quantitative, pregnancy   Diet NPO time specified   Pregnancy, urine POC   ABO/Rh   Results for orders placed or performed during the hospital encounter of 08/02/22 (from the past 24 hour(s))  Pregnancy, urine POC     Status: Abnormal   Collection Time: 08/02/22 11:03 AM  Result Value Ref Range   Preg Test, Ur POSITIVE (A) NEGATIVE  Urinalysis, Routine w reflex microscopic Urine, Clean Catch     Status: Abnormal   Collection Time: 08/02/22 11:11 AM  Result Value Ref Range   Color, Urine YELLOW YELLOW   APPearance HAZY (A) CLEAR   Specific Gravity, Urine 1.017 1.005 - 1.030   pH 5.0 5.0 - 8.0   Glucose, UA NEGATIVE NEGATIVE mg/dL   Hgb urine dipstick SMALL (A) NEGATIVE   Bilirubin Urine NEGATIVE NEGATIVE   Ketones, ur NEGATIVE NEGATIVE mg/dL   Protein, ur NEGATIVE NEGATIVE mg/dL   Nitrite NEGATIVE NEGATIVE   Leukocytes,Ua NEGATIVE NEGATIVE   RBC / HPF 0-5 0 - 5 RBC/hpf   WBC, UA 0-5 0 - 5 WBC/hpf   Bacteria, UA RARE (A) NONE SEEN   Squamous Epithelial / LPF 0-5 0 - 5   Mucus PRESENT   CBC     Status: None   Collection Time: 08/02/22 11:28 AM  Result Value Ref Range   WBC 7.2 4.0 - 10.5 K/uL   RBC 4.66 3.87 - 5.11 MIL/uL   Hemoglobin 14.6 12.0 - 15.0 g/dL   HCT 27.0 35.0 - 09.3 %   MCV 89.9 80.0 - 100.0 fL   MCH 31.3 26.0 - 34.0 pg   MCHC 34.8 30.0 - 36.0 g/dL   RDW 81.8 29.9 - 37.1 %   Platelets 280 150 - 400 K/uL   nRBC 0.0 0.0 - 0.2 %  ABO/Rh     Status: None (Preliminary result)   Collection Time: 08/02/22 11:28 AM  Result Value Ref Range   ABO/RH(D) PENDING   US OB LESS THAN 14 WEEKS WITH OB TRANSVAGINAL  Result Date: 08/02/2022 CLINICAL DATA:  Cramps for 1 week, pregnant, LMP 06/28/2022, quantitative beta hCG pending EXAM: OBSTETRIC <14 WK [place] AND TRANSVAGINAL OB US TECHNIQUE: Both transabdominal and transvaginal ultrasound examinations were  performed for complete  evaluation of the gestation as well as the maternal uterus, adnexal regions, and pelvic cul-de-sac. Transvaginal technique was performed to assess early pregnancy. COMPARISON:  None Available. FINDINGS: Intrauterine gestational sac: Present, single Yolk sac:  Not identified Embryo:  Not identified Cardiac Activity: N/A Heart Rate: N/A  bpm MSD: 2.8 mm   5 w   0 d Subchorionic hemorrhage:  None visualized. Maternal uterus/adnexae: Maternal uterus otherwise normal appearance. Ovaries unremarkable. No free pelvic fluid or adnexal masses. IMPRESSION: Tiny gestational sac seen within uterus, mean sac diameter corresponding to 5 weeks 0 days EGA. Otherwise negative exam. Electronically Signed   By: Ulyses Southward M.D.   On: 08/02/2022 12:21     MDM Lab results reviewed and interpreted by me   UA reflexed to culture.  Vital signs stable Not currently experiencing pain while in MAU  Small gestational sac noted intrauterine consistent with [redacted]w[redacted]d pregnancy.  Based on [place] results, Low suspicion for ectopic pregnancy GC/Ch PENDING  Wet prep Negative  Plan for discharge    Assessment and Plan   1. Pregnancy at early stage   2. [redacted] weeks gestation of pregnancy   3. Unwanted pregnancy with plans for termination   4. Abdominal pain during pregnancy in first trimester    - Discussed that this is a early indication of a  Intrauterine pregnancy with a gestational sac.  - Patient does not desire to continue with pregnancy. Information on termination locations provided to patient patient. Pregnancy verification letter provided.  - Patient in town visiting family. Encouraged to follow up with hometown OB in [place]. Also encouraged contraception if patient does not desire pregnancy at this time.  - Patient discharged home in stable condition and may return to MAU as needed.   Claudette Head, MSN CNM  08/02/2022, 12:33 PM

## 2022-08-02 NOTE — MAU Note (Signed)
Evelyn Rodriguez is a 26 y.o. at Unknown here in MAU reporting: about 5 wks preg.  Been having cramps in lower abd, no bleeding.  Concerned about ectopic.  Was thinking she was going to start her period, but hasn't.  Was at [facility], was trying to terminate, was sent here for further eval as they did not see a definite preg and because of the cramping.  LMP: 7/18 Onset of complaint: wk Pain score: moderate Vitals:   08/02/22 1057  BP: 127/77  Pulse: 82  Resp: 16  Temp: 97.8 F (36.6 C)  SpO2: 100%     Lab orders placed from triage:  UPT

## 2022-08-02 NOTE — Discharge Instructions (Signed)
[facility] Address: [address] [place]. [address], [place], [place] [address] Hours:  Monday 9AM-5PM Tuesday 10AM-6PM Wednesday 11AM-7PM Thursday 9AM-5PM Friday              8AM-2PM Saturday Sunday  Phone: [phone]  Terminations on Tuesdays and Thursdays

## 2022-08-03 LAB — GC/CHLAMYDIA PROBE AMP (~~LOC~~) NOT AT ARMC
Chlamydia: NEGATIVE
Comment: NEGATIVE
Comment: NORMAL
Neisseria Gonorrhea: NEGATIVE

## 2022-08-03 LAB — CULTURE, OB URINE: Culture: NO GROWTH
